# Patient Record
Sex: Female | Born: 1935 | Race: White | Hispanic: No | Smoking: Never smoker
Health system: Southern US, Community
[De-identification: ages and names within clinical notes are randomized; demographics above are authoritative.]

## PROBLEM LIST (undated history)

## (undated) DIAGNOSIS — F329 Major depressive disorder, single episode, unspecified: Secondary | ICD-10-CM

## (undated) DIAGNOSIS — F32A Depression, unspecified: Secondary | ICD-10-CM

## (undated) DIAGNOSIS — R413 Other amnesia: Secondary | ICD-10-CM

## (undated) DIAGNOSIS — M1990 Unspecified osteoarthritis, unspecified site: Secondary | ICD-10-CM

## (undated) DIAGNOSIS — M069 Rheumatoid arthritis, unspecified: Secondary | ICD-10-CM

## (undated) DIAGNOSIS — J302 Other seasonal allergic rhinitis: Secondary | ICD-10-CM

## (undated) DIAGNOSIS — M810 Age-related osteoporosis without current pathological fracture: Secondary | ICD-10-CM

## (undated) HISTORY — DX: Age-related osteoporosis without current pathological fracture: M81.0

## (undated) HISTORY — PX: EYE SURGERY: SHX253

## (undated) HISTORY — PX: HYSTERECTOMY: SHX81

## (undated) HISTORY — PX: TOTAL KNEE ARTHROPLASTY: SHX125

## (undated) HISTORY — DX: Rheumatoid arthritis, unspecified: M06.9

## (undated) HISTORY — PX: APPENDECTOMY (OPEN): SHX54

## (undated) HISTORY — DX: Unspecified osteoarthritis, unspecified site: M19.90

## (undated) HISTORY — DX: Other seasonal allergic rhinitis: J30.2

## (undated) HISTORY — PX: BACK SURGERY: SHX140

## (undated) HISTORY — PX: CHOLECYSTECTOMY: SHX55

## (undated) HISTORY — PX: HIP ARTHROPLASTY: SHX981

## (undated) HISTORY — PX: ABDOMINAL HYSTERECTOMY: SHX81

## (undated) HISTORY — PX: KYPHOPLASTY: SHX5884

## (undated) HISTORY — PX: APPENDECTOMY: SHX54

## (undated) HISTORY — PX: CATARACT EXTRACTION: SUR2

---

## 1997-11-13 ENCOUNTER — Inpatient Hospital Stay
Admission: RE | Admit: 1997-11-13 | Disposition: A | Discharge status: Home Health | Payer: Self-pay | Source: Ambulatory Visit | Admitting: Specialist

## 1999-11-11 ENCOUNTER — Inpatient Hospital Stay
Admission: RE | Admit: 1999-11-11 | Disposition: A | Discharge status: Home / Self Care | Payer: Self-pay | Source: Ambulatory Visit | Admitting: Specialist

## 2007-01-20 HISTORY — PX: HIP FRACTURE SURGERY: SHX118

## 2012-01-20 HISTORY — PX: HIP FRACTURE SURGERY: SHX118

## 2013-05-15 ENCOUNTER — Encounter (INDEPENDENT_AMBULATORY_CARE_PROVIDER_SITE_OTHER): Payer: Self-pay

## 2013-05-15 ENCOUNTER — Ambulatory Visit (INDEPENDENT_AMBULATORY_CARE_PROVIDER_SITE_OTHER): Payer: Medicare Other | Admitting: Family Medicine

## 2013-05-15 ENCOUNTER — Encounter (INDEPENDENT_AMBULATORY_CARE_PROVIDER_SITE_OTHER): Payer: Self-pay | Admitting: Family Medicine

## 2013-05-15 VITALS — BP 179/89 | HR 82 | Temp 98.3°F | Resp 14 | Ht <= 58 in | Wt 85.6 lb

## 2013-05-15 DIAGNOSIS — H543 Unqualified visual loss, both eyes: Secondary | ICD-10-CM

## 2013-05-15 DIAGNOSIS — M069 Rheumatoid arthritis, unspecified: Secondary | ICD-10-CM | POA: Insufficient documentation

## 2013-05-15 DIAGNOSIS — M1990 Unspecified osteoarthritis, unspecified site: Secondary | ICD-10-CM | POA: Insufficient documentation

## 2013-05-15 DIAGNOSIS — I10 Essential (primary) hypertension: Secondary | ICD-10-CM | POA: Insufficient documentation

## 2013-05-15 DIAGNOSIS — M810 Age-related osteoporosis without current pathological fracture: Secondary | ICD-10-CM

## 2013-05-15 DIAGNOSIS — Z111 Encounter for screening for respiratory tuberculosis: Secondary | ICD-10-CM

## 2013-05-15 DIAGNOSIS — R627 Adult failure to thrive: Secondary | ICD-10-CM | POA: Insufficient documentation

## 2013-05-15 NOTE — Progress Notes (Signed)
Subjective:       Patient ID: Holly Flowers is a 80 y.o. female.    HPI  Holly Flowers is a 80 y.o. female who comes in today for exam before nursing home visit.   Holly Flowers is currently living with son and daughter in law.  She is moving into an assisted living facility due to increase acuity of care for the family.  She has the problems listed below   She takes IBU 200 mg BID and a vitamin D tablet. The family has been supplementing her with ensure as well. Weight was dropping, but has recently stabilized.        Active Problems  Patient Active Problem List    Diagnosis Date Noted   . Osteoporosis 05/15/2013   . Osteoarthritis 05/15/2013   . Rheumatoid arthritis 05/15/2013   . HTN (hypertension) 05/15/2013   . Low, vision, both eyes 05/15/2013   . Failure to thrive in adult 05/15/2013       Past Surgical History  Past Surgical History   Procedure Date   . Hip fracture surgery 2009     left hip   . Hip fracture surgery 2014     left hip   . Total knee arthroplasty    . Cholecystectomy    . Appendectomy    . Hysterectomy    . Back surgery    . Eye surgery      lens replacement       Medications  No current outpatient prescriptions on file.       Allergies  Allergies   Allergen Reactions   . Iodine      itching   . Shellfish-Derived Products      Itching, hives, vomiting       Gyn History    Obstetric History     No data available     No LMP recorded. Patient is postmenopausal.    Social History  History     Social History   . Marital Status: Widowed     Spouse Name: N/A     Number of Children: N/A   . Years of Education: N/A     Occupational History   . retired      Social History Main Topics   . Smoking status: Never Smoker    . Smokeless tobacco: Not on file   . Alcohol Use: No   . Drug Use: No   . Sexually Active: No     Other Topics Concern   . Not on file     Social History Narrative   . No narrative on file       Family History  Family History   Problem Relation Age of Onset   . Diabetes Father    . Stroke Father     . Diabetes Son        Review of Systems  CONST: No wt change, no f/c/s  EYES:  Has limited vision  HENT:  No hearing loss, tinnitus, allergies. has teeth problems  CV:   No CP, palpitations, leg swelling  RESP:  No SOB, cough, wheeze  GI:   No n/v, diarrhea, constipation, abd pain, heartburn, blood in stool  GU:   No dysuria, frequency, incontinence, breast changes, abnormal vaginal bleeding or discharge  MS:  Has chronic diffuse joint pain  SKIN:   No rashes or concerning moles  ENDO:  No heat/cold intolerance, menstrual changes, polyuria, polydipsia  HEME:  No bruising/bleeding, swollen glands  NEURO:  No HA, LH, dizziness, numbness/tingling, weakness  PSYCH:  No depressed mood, anhedonia, anxiety                  Review of Systems        Objective:    Physical Exam    Physical Exam:  BP 179/89  Pulse 82  Temp 98.3 F (36.8 C) (Oral)  Resp 14  Ht 1.473 m (4\' 10" )  Wt 38.828 kg (85 lb 9.6 oz)  BMI 17.90 kg/m2  SpO2 97%  Wt Readings from Last 3 Encounters:   05/15/13 38.828 kg (85 lb 9.6 oz)     Vital signs reviewed  CONST: NAD, low body weight  HENT: B TMs normal, OP clear, normal dentition  EYES: PERRL, no pallor or scleral icterus, conjunctiva not injected  NECK: supple, no TM or nodules  LYMPH: no cervical or supraclavicular LAD  CV: RRR, no m/r/g.  No LE edema.  Pedal pulses present and equal.  RESP: CTAB, normal effort  GI: soft, NT/ND, NABS, no HSM  SKIN: warm, dry.  no visible rashes or concerning moles  MSK: Very frail. Marked kyphosis. Ulnar drift of all the fingers on both hands.  NEURO: Cranial nerves 2-12 intact, normal gait  PSYCH: A&O x3, normal mood and affect. Questions of cognitive function including short and long term memory are intact.    Holly Flowers is a 80 y.o. female who presents today for the Medicare AWV.      (1) Review of medical and social history    Names of other Physicians on care team:    Patient Active Problem List    Diagnosis Date Noted   . Osteoporosis 05/15/2013   .  Osteoarthritis 05/15/2013   . Rheumatoid arthritis 05/15/2013   . HTN (hypertension) 05/15/2013   . Low, vision, both eyes 05/15/2013   . Failure to thrive in adult 05/15/2013     Past Surgical History   Procedure Date   . Hip fracture surgery 2009     left hip   . Hip fracture surgery 2014     left hip   . Total knee arthroplasty    . Cholecystectomy    . Appendectomy    . Hysterectomy    . Back surgery    . Eye surgery      lens replacement     No current outpatient prescriptions on file.     Allergies   Allergen Reactions   . Iodine      itching   . Shellfish-Derived Products      Itching, hives, vomiting     Family History   Problem Relation Age of Onset   . Diabetes Father    . Stroke Father    . Diabetes Son      History     Social History   . Marital Status: Widowed     Spouse Name: N/A     Number of Children: N/A   . Years of Education: N/A     Occupational History   . retired      Social History Main Topics   . Smoking status: Never Smoker    . Smokeless tobacco: Not on file   . Alcohol Use: No   . Drug Use: No   . Sexually Active: No     Other Topics Concern   . Not on file     Social History Narrative   . No narrative on file  Diet: Regular with some chewing dysfunction from poor dentition.  Physical Activity: walks with walker about the home.    (2) Review of Risk Factors for Depression and Mood Disorders    Result of depression screen: Negative    (3) Review of Functional Ability, Level of Safety, and Psychosocial/Behavioral Risks        Results of HHIE-S:   ADLs: 4  IADLs: 1  (see attached form)  Fall Risk:   Get-Up-and-Go Test (if necessary): Significant. Walks with walker. Transfers without difficulty.   Home Safety: Handout given    (4) Examination    BP 179/89  Pulse 82  Temp 98.3 F (36.8 C) (Oral)  Resp 14  Ht 1.473 m (4\' 10" )  Wt 38.828 kg (85 lb 9.6 oz)  BMI 17.90 kg/m2  SpO2 97%  Visual Acuity Screen:  Cognitive assessment:  Other relevant factors based on medical and social  history:    (5) List of Risk Factors and Treatment Options  Nurtritional and self care. Needs assist to bathe and have meals and meds provided, able to self administer.       (6) Education, Counseling, Referral    Going into assisted living facility.       (7) Other preventative services    Needs dental care        Assessment:       1. Osteoporosis     2. Osteoarthritis     3. Rheumatoid arthritis     4. HTN (hypertension)     5. Screening for tuberculosis  TB Skin Test   6. Low, vision, both eyes     7. Failure to thrive in adult             Plan:       Home assessment is not needed, Faline is moving to an assisted living facility. I am going to recommend reduced salt intake, but will hold on recommending BP meds at this time. Agree with ensure nutritional supplement. Dental work may also help.  Should continue vitamin D and encourage weight bearing activities.  Forms are signed.  She will continue on current regimen of nutritional care and low dose of IBU for pain.

## 2016-10-31 ENCOUNTER — Encounter (HOSPITAL_COMMUNITY): Payer: Self-pay

## 2016-10-31 ENCOUNTER — Emergency Department (HOSPITAL_COMMUNITY): Payer: Medicare Other

## 2016-10-31 ENCOUNTER — Emergency Department (HOSPITAL_COMMUNITY)
Admission: EM | Admit: 2016-10-31 | Discharge: 2016-10-31 | Disposition: A | Discharge status: Home / Self Care | Payer: Medicare Other | Attending: Emergency Medicine | Admitting: Emergency Medicine

## 2016-10-31 DIAGNOSIS — S0990XA Unspecified injury of head, initial encounter: Secondary | ICD-10-CM

## 2016-10-31 DIAGNOSIS — Y998 Other external cause status: Secondary | ICD-10-CM | POA: Diagnosis not present

## 2016-10-31 DIAGNOSIS — Y92009 Unspecified place in unspecified non-institutional (private) residence as the place of occurrence of the external cause: Secondary | ICD-10-CM | POA: Diagnosis not present

## 2016-10-31 DIAGNOSIS — W01198A Fall on same level from slipping, tripping and stumbling with subsequent striking against other object, initial encounter: Secondary | ICD-10-CM | POA: Insufficient documentation

## 2016-10-31 DIAGNOSIS — Y9389 Activity, other specified: Secondary | ICD-10-CM | POA: Diagnosis not present

## 2016-10-31 DIAGNOSIS — S0101XA Laceration without foreign body of scalp, initial encounter: Secondary | ICD-10-CM | POA: Insufficient documentation

## 2016-10-31 DIAGNOSIS — S42201A Unspecified fracture of upper end of right humerus, initial encounter for closed fracture: Secondary | ICD-10-CM

## 2016-10-31 DIAGNOSIS — W19XXXA Unspecified fall, initial encounter: Secondary | ICD-10-CM

## 2016-10-31 DIAGNOSIS — Z23 Encounter for immunization: Secondary | ICD-10-CM | POA: Insufficient documentation

## 2016-10-31 MED ORDER — TRAMADOL HCL 50 MG PO TABS: 50.0000 mg | ORAL_TABLET | Freq: Four times a day (QID) | ORAL | 0 refills | Status: AC | PRN

## 2016-10-31 MED ORDER — TETANUS-DIPHTH-ACELL PERTUSSIS 5-2.5-18.5 LF-MCG/0.5 IM SUSP
0.5000 mL | Freq: Once | INTRAMUSCULAR | Status: AC
  Administered 2016-10-31: 0.5 mL via INTRAMUSCULAR
  Filled 2016-10-31: qty 0.5

## 2016-10-31 NOTE — ED Provider Notes (Signed)
[facility]-EMERGENCY DEPT Provider Note   CSN: [id number] Arrival date & time: 2016-11-22  1638     History   Chief Complaint Chief Complaint  Patient presents with  . Fall    HPI April Preston is a 83 y.o. female.  Patient at home tripped over dog while using her walker. Patient fell and hit her head on the floor. No loss of consciousness. Patient with complaint of right-sided head pain there was some bleeding from there. And right shoulder pain. Patient is not on any blood thinners. Patient without any chest pain shortness of breath neck pain or back pain no hip pain no lower extremity pain.      No past medical history on file.  There are no active problems to display for this patient.   No past surgical history on file.  OB History    No data available       Home Medications    Prior to Admission medications   Medication Sig Start Date End Date Taking? Authorizing Provider  traMADol (ULTRAM) 50 MG tablet Take 1 tablet (50 mg total) by mouth every 6 (six) hours as needed for moderate pain. 2016-11-22   Vanetta Mulders, MD    Family History No family history on file.  Social History Social History  Substance Use Topics  . Smoking status: Never Smoker  . Smokeless tobacco: Never Used  . Alcohol use No     Allergies   Shellfish allergy   Review of Systems Review of Systems  Constitutional: Negative for fever.  HENT: Negative for congestion.   Eyes: Negative for redness.  Respiratory: Negative for shortness of breath.   Cardiovascular: Negative for chest pain.  Gastrointestinal: Negative for abdominal pain, nausea and vomiting.  Genitourinary: Negative for dysuria.  Musculoskeletal: Negative for back pain, joint swelling and neck pain.  Skin: Positive for wound. Negative for rash.  Neurological: Negative for headaches.  Hematological: Does not bruise/bleed easily.  Psychiatric/Behavioral: Negative for confusion.     Physical Exam Updated Vital  Signs BP (!) 192/70 (BP Location: Left Arm)   Pulse 77   Temp 97.9 F (36.6 C) (Oral)   Resp 18   Ht 1.575 m ( )   Wt 38.6 kg (85 lb)   SpO2 99%   BMI 15.55 kg/m   Physical Exam  Constitutional: She appears well-developed and well-nourished. No distress.  HENT:  Head: Normocephalic.  Mouth/Throat: Oropharynx is clear and moist.  A right-sided scalp with about a 5 mm laceration. Closed no active bleeding.  Eyes: Pupils are equal, round, and reactive to light. Conjunctivae and EOM are normal.  Neck: Normal range of motion. Neck supple.  Cardiovascular: Normal rate, regular rhythm and normal heart sounds.   Pulmonary/Chest: Effort normal and breath sounds normal.  Abdominal: Soft. Bowel sounds are normal.  Musculoskeletal: Normal range of motion. She exhibits tenderness and deformity.  Oxford humerus deformity and tenderness. No obvious joint dislocation. No tenderness at the elbow or wrist to the right arm. Radial pulse 1+. Patient has bilateral arthritic changes to both hands.  Neurological: She is alert. No cranial nerve deficit or sensory deficit. She exhibits normal muscle tone. Coordination normal.  Skin: Skin is warm.  Nursing note and vitals reviewed.    ED Treatments / Results  Labs (all labs ordered are listed, but only abnormal results are displayed) Labs Reviewed - No data to display  EKG  EKG Interpretation None       Radiology Ct Head Wo Contrast  Result Date: 10/26/2016 CLINICAL DATA:  Cervical spine trauma.  High clinical risk. EXAM: CT HEAD WITHOUT CONTRAST CT CERVICAL SPINE WITHOUT CONTRAST TECHNIQUE: Multidetector CT imaging of the head and cervical spine was performed following the standard protocol without intravenous contrast. Multiplanar CT image reconstructions of the cervical spine were also generated. COMPARISON:  None. FINDINGS: CT HEAD FINDINGS Brain: No evidence of acute infarction, hemorrhage, hydrocephalus, extra-axial collection or mass  lesion/mass effect. Advanced brain parenchymal volume loss and periventricular microangiopathy. Vascular: Calcific atherosclerotic disease at the skullbase. Skull: Normal. Negative for fracture or focal lesion. Sinuses/Orbits: No acute finding. Other: Right parietal skull hematoma. CT CERVICAL SPINE FINDINGS Alignment: Minimal anterolisthesis of C6 on C7. Skull base and vertebrae: No acute fracture. No primary bone lesion or focal pathologic process. Soft tissues and spinal canal: No prevertebral fluid or swelling. No visible canal hematoma. Disc levels: Multilevel osteoarthritic changes with disc space narrowing, osteophyte formation. Severe multilevel posterior facet arthropathy. Upper chest: Negative. Other: None. IMPRESSION: No acute intracranial abnormality. Advanced brain parenchymal atrophy and chronic microvascular disease. No evidence of acute fracture of the cervical spine. Multilevel osteoarthritic changes. Minimal anterolisthesis of C6 on C7, likely degenerative. Electronically Signed   By: Ted Mcalpine M.D.   On: 10/23/2016 17:51   Ct Cervical Spine Wo Contrast  Result Date: 11/14/2016 CLINICAL DATA:  Cervical spine trauma.  High clinical risk. EXAM: CT HEAD WITHOUT CONTRAST CT CERVICAL SPINE WITHOUT CONTRAST TECHNIQUE: Multidetector CT imaging of the head and cervical spine was performed following the standard protocol without intravenous contrast. Multiplanar CT image reconstructions of the cervical spine were also generated. COMPARISON:  None. FINDINGS: CT HEAD FINDINGS Brain: No evidence of acute infarction, hemorrhage, hydrocephalus, extra-axial collection or mass lesion/mass effect. Advanced brain parenchymal volume loss and periventricular microangiopathy. Vascular: Calcific atherosclerotic disease at the skullbase. Skull: Normal. Negative for fracture or focal lesion. Sinuses/Orbits: No acute finding. Other: Right parietal skull hematoma. CT CERVICAL SPINE FINDINGS Alignment: Minimal  anterolisthesis of C6 on C7. Skull base and vertebrae: No acute fracture. No primary bone lesion or focal pathologic process. Soft tissues and spinal canal: No prevertebral fluid or swelling. No visible canal hematoma. Disc levels: Multilevel osteoarthritic changes with disc space narrowing, osteophyte formation. Severe multilevel posterior facet arthropathy. Upper chest: Negative. Other: None. IMPRESSION: No acute intracranial abnormality. Advanced brain parenchymal atrophy and chronic microvascular disease. No evidence of acute fracture of the cervical spine. Multilevel osteoarthritic changes. Minimal anterolisthesis of C6 on C7, likely degenerative. Electronically Signed   By: Ted Mcalpine M.D.   On: 11/18/2016 17:51   Dg Humerus Right  Result Date: 10/25/2016 CLINICAL DATA:  Right shoulder pain after fall. EXAM: RIGHT HUMERUS - 2+ VIEW COMPARISON:  None. FINDINGS: There is a predominantly transverse fracture through the proximal humerus surgical neck. Severe glenohumeral degenerative changes. High-riding humeral head, consistent with chronic rotator cuff pathology. Diffuse osteopenia. IMPRESSION: Predominantly transverse fracture through the proximal humerus surgical neck. Electronically Signed   By: Obie Dredge M.D.   On: 11/07/2016 17:48    Procedures Procedures (including critical care time)  Medications Ordered in ED Medications  Tdap (BOOSTRIX) injection 0.5 mL (not administered)     Initial Impression / Assessment and Plan / ED Course  I have reviewed the triage vital signs and the nursing notes.  Pertinent labs & imaging results that were available during my care of the patient were reviewed by me and considered in my medical decision making (see chart for details).    The patient has  post fall no syncope. No loss of consciousness. Patient with 5 mm laceration to the right side of the scalp. CT of head and neck without any acute findings. Patient also with proximal arm  pain on the right side. X-rays show proximal humerus fracture. Will be treated with sling and follow-up with orthopedics.  Scalp laceration does not require suturing gluing. Currently closed.   Final Clinical Impressions(s) / ED Diagnoses   Final diagnoses:  Fall, initial encounter  Injury of head, initial encounter  Laceration of scalp, initial encounter  Closed fracture of proximal end of right humerus, unspecified fracture morphology, initial encounter    New Prescriptions New Prescriptions   TRAMADOL (ULTRAM) 50 MG TABLET    Take 1 tablet (50 mg total) by mouth every 6 (six) hours as needed for moderate pain.     Vanetta Mulders, MD 11/03/16 [phone]

## 2016-10-31 NOTE — ED Triage Notes (Signed)
Pt was at home and tripped over dog while using cane. Pt hit right side of head first and then right shoulder. Pt denies SOB/chest pain, pt denies blood thinners.  Pt lives with daughter.  AOx2

## 2016-10-31 NOTE — Discharge Instructions (Signed)
Small laceration to the scalp along its own. May do a little bit of bleeding over the next 24 hours. CT of head without any acute findings. CT of neck without any acute injuries. The right proximal humerus is fractured this will heal with sling. But orthopedic follow-up will be important. Referral information for Dr. August Saucer provided. Call on Monday for follow-up. Tramadol as needed for pain. Also recommend contacting primary care doctor on Monday for assistance at home.  The sling is to remain in place at all times but can be removed to take a shower. Tetanus was updated today.

## 2016-11-19 DEATH — deceased

## 2017-01-08 ENCOUNTER — Other Ambulatory Visit: Payer: Self-pay | Admitting: Physician Assistant

## 2017-01-08 DIAGNOSIS — R413 Other amnesia: Secondary | ICD-10-CM

## 2017-01-13 ENCOUNTER — Ambulatory Visit
Admission: RE | Admit: 2017-01-13 | Discharge: 2017-01-13 | Disposition: A | Discharge status: Home / Self Care | Payer: Medicare Other | Source: Ambulatory Visit | Attending: Physician Assistant | Admitting: Physician Assistant

## 2017-01-13 DIAGNOSIS — R413 Other amnesia: Secondary | ICD-10-CM

## 2017-12-26 ENCOUNTER — Encounter: Payer: Self-pay | Admitting: Cardiology

## 2017-12-26 ENCOUNTER — Emergency Department (HOSPITAL_COMMUNITY): Payer: Medicare Other

## 2017-12-26 ENCOUNTER — Encounter (HOSPITAL_COMMUNITY): Admission: EM | Disposition: E | Payer: Self-pay | Source: Home / Self Care | Attending: Cardiovascular Disease

## 2017-12-26 ENCOUNTER — Inpatient Hospital Stay (HOSPITAL_COMMUNITY): Payer: Medicare Other

## 2017-12-26 DIAGNOSIS — M1990 Unspecified osteoarthritis, unspecified site: Secondary | ICD-10-CM | POA: Diagnosis present

## 2017-12-26 DIAGNOSIS — F329 Major depressive disorder, single episode, unspecified: Secondary | ICD-10-CM | POA: Diagnosis present

## 2017-12-26 DIAGNOSIS — Z833 Family history of diabetes mellitus: Secondary | ICD-10-CM | POA: Diagnosis not present

## 2017-12-26 DIAGNOSIS — Z79891 Long term (current) use of opiate analgesic: Secondary | ICD-10-CM

## 2017-12-26 DIAGNOSIS — Z66 Do not resuscitate: Secondary | ICD-10-CM | POA: Diagnosis present

## 2017-12-26 DIAGNOSIS — Z96649 Presence of unspecified artificial hip joint: Secondary | ICD-10-CM | POA: Diagnosis present

## 2017-12-26 DIAGNOSIS — W182XXA Fall in (into) shower or empty bathtub, initial encounter: Secondary | ICD-10-CM | POA: Diagnosis present

## 2017-12-26 DIAGNOSIS — S0990XA Unspecified injury of head, initial encounter: Secondary | ICD-10-CM | POA: Diagnosis present

## 2017-12-26 DIAGNOSIS — Y92009 Unspecified place in unspecified non-institutional (private) residence as the place of occurrence of the external cause: Secondary | ICD-10-CM

## 2017-12-26 DIAGNOSIS — Z820 Family history of epilepsy and other diseases of the nervous system: Secondary | ICD-10-CM | POA: Diagnosis not present

## 2017-12-26 DIAGNOSIS — Z681 Body mass index (BMI) 19 or less, adult: Secondary | ICD-10-CM | POA: Diagnosis not present

## 2017-12-26 DIAGNOSIS — R570 Cardiogenic shock: Secondary | ICD-10-CM

## 2017-12-26 DIAGNOSIS — R413 Other amnesia: Secondary | ICD-10-CM | POA: Insufficient documentation

## 2017-12-26 DIAGNOSIS — Z90710 Acquired absence of both cervix and uterus: Secondary | ICD-10-CM

## 2017-12-26 DIAGNOSIS — I2119 ST elevation (STEMI) myocardial infarction involving other coronary artery of inferior wall: Principal | ICD-10-CM

## 2017-12-26 DIAGNOSIS — R64 Cachexia: Secondary | ICD-10-CM | POA: Diagnosis present

## 2017-12-26 DIAGNOSIS — Y93E1 Activity, personal bathing and showering: Secondary | ICD-10-CM | POA: Diagnosis not present

## 2017-12-26 DIAGNOSIS — W19XXXA Unspecified fall, initial encounter: Secondary | ICD-10-CM

## 2017-12-26 DIAGNOSIS — R000 Tachycardia, unspecified: Secondary | ICD-10-CM | POA: Diagnosis present

## 2017-12-26 DIAGNOSIS — Z79899 Other long term (current) drug therapy: Secondary | ICD-10-CM | POA: Diagnosis not present

## 2017-12-26 DIAGNOSIS — R52 Pain, unspecified: Secondary | ICD-10-CM

## 2017-12-26 DIAGNOSIS — F0390 Unspecified dementia without behavioral disturbance: Secondary | ICD-10-CM | POA: Diagnosis present

## 2017-12-26 DIAGNOSIS — I700 Atherosclerosis of aorta: Secondary | ICD-10-CM | POA: Diagnosis present

## 2017-12-26 DIAGNOSIS — I2111 ST elevation (STEMI) myocardial infarction involving right coronary artery: Secondary | ICD-10-CM | POA: Insufficient documentation

## 2017-12-26 DIAGNOSIS — F32A Depression, unspecified: Secondary | ICD-10-CM | POA: Insufficient documentation

## 2017-12-26 HISTORY — DX: Major depressive disorder, single episode, unspecified: F32.9

## 2017-12-26 HISTORY — DX: Depression, unspecified: F32.A

## 2017-12-26 HISTORY — DX: Other amnesia: R41.3

## 2017-12-26 LAB — CBC WITH DIFFERENTIAL/PLATELET
Abs Immature Granulocytes: 0.99 10*3/uL — ABNORMAL HIGH (ref 0.00–0.07)
BASOS PCT: 1 %
Basophils Absolute: 0.1 10*3/uL (ref 0.0–0.1)
EOS ABS: 0 10*3/uL (ref 0.0–0.5)
EOS PCT: 0 %
HCT: 42.9 % (ref 36.0–46.0)
Hemoglobin: 12.5 g/dL (ref 12.0–15.0)
Immature Granulocytes: 4 %
Lymphocytes Relative: 6 %
Lymphs Abs: 1.4 10*3/uL (ref 0.7–4.0)
MCH: 31.5 pg (ref 26.0–34.0)
MCHC: 29.1 g/dL — AB (ref 30.0–36.0)
MCV: 108.1 fL — AB (ref 80.0–100.0)
MONO ABS: 2.9 10*3/uL — AB (ref 0.1–1.0)
MONOS PCT: 13 %
Neutro Abs: 17 10*3/uL — ABNORMAL HIGH (ref 1.7–7.7)
Neutrophils Relative %: 76 %
PLATELETS: 188 10*3/uL (ref 150–400)
RBC: 3.97 MIL/uL (ref 3.87–5.11)
RDW: 13.6 % (ref 11.5–15.5)
WBC: 22.4 10*3/uL — AB (ref 4.0–10.5)
nRBC: 0 % (ref 0.0–0.2)

## 2017-12-26 LAB — COMPREHENSIVE METABOLIC PANEL
ALT: 601 U/L — AB (ref 0–44)
AST: 684 U/L — AB (ref 15–41)
Albumin: 3.3 g/dL — ABNORMAL LOW (ref 3.5–5.0)
Alkaline Phosphatase: 70 U/L (ref 38–126)
Anion gap: 22 — ABNORMAL HIGH (ref 5–15)
BUN: 38 mg/dL — AB (ref 8–23)
CHLORIDE: 100 mmol/L (ref 98–111)
CO2: 10 mmol/L — AB (ref 22–32)
CREATININE: 2.13 mg/dL — AB (ref 0.44–1.00)
Calcium: 8.7 mg/dL — ABNORMAL LOW (ref 8.9–10.3)
Glucose, Bld: 188 mg/dL — ABNORMAL HIGH (ref 70–99)
POTASSIUM: 5.4 mmol/L — AB (ref 3.5–5.1)
Sodium: 132 mmol/L — ABNORMAL LOW (ref 135–145)
Total Bilirubin: 2.7 mg/dL — ABNORMAL HIGH (ref 0.3–1.2)
Total Protein: 7.2 g/dL (ref 6.5–8.1)

## 2017-12-26 LAB — LIPID PANEL
CHOL/HDL RATIO: 2.3 ratio
CHOLESTEROL: 189 mg/dL (ref 0–200)
HDL: 82 mg/dL (ref >40)
LDL Cholesterol: 95 mg/dL (ref 0–99)
Triglycerides: 58 mg/dL (ref <150)
VLDL: 12 mg/dL (ref 0–40)

## 2017-12-26 LAB — PROTIME-INR
INR: 1.74
PROTHROMBIN TIME: 20.1 s — AB (ref 11.4–15.2)

## 2017-12-26 LAB — TROPONIN I
Troponin I: 45.84 ng/mL (ref <0.03)
Troponin I: 44.56 ng/mL (ref <0.03)

## 2017-12-26 LAB — MRSA PCR SCREENING: MRSA by PCR: NEGATIVE

## 2017-12-26 LAB — MAGNESIUM: Magnesium: 2.5 mg/dL — ABNORMAL HIGH (ref 1.7–2.4)

## 2017-12-26 LAB — TSH: TSH: 6.251 u[IU]/mL — ABNORMAL HIGH (ref 0.350–4.500)

## 2017-12-26 LAB — APTT: aPTT: 34 seconds (ref 24–36)

## 2017-12-26 SURGERY — INVASIVE LAB ABORTED CASE

## 2017-12-26 MED ORDER — ONDANSETRON HCL 4 MG/2ML IJ SOLN
4.0000 mg | Freq: Once | INTRAMUSCULAR | Status: DC
  Filled 2017-12-26: qty 2

## 2017-12-26 MED ORDER — ASPIRIN 81 MG PO CHEW: 324.0000 mg | CHEWABLE_TABLET | ORAL | Status: DC

## 2017-12-26 MED ORDER — ASPIRIN EC 81 MG PO TBEC: 81.0000 mg | DELAYED_RELEASE_TABLET | Freq: Every day | ORAL | Status: DC

## 2017-12-26 MED ORDER — ONDANSETRON HCL 4 MG/2ML IJ SOLN: 4.0000 mg | Freq: Four times a day (QID) | INTRAMUSCULAR | Status: DC | PRN

## 2017-12-26 MED ORDER — HEPARIN (PORCINE) IN NACL 1000-0.9 UT/500ML-% IV SOLN
INTRAVENOUS | Status: AC
  Filled 2017-12-26: qty 1000

## 2017-12-26 MED ORDER — ORAL CARE MOUTH RINSE
15.0000 mL | Freq: Two times a day (BID) | OROMUCOSAL | Status: DC
  Administered 2017-12-26: 15 mL via OROMUCOSAL

## 2017-12-26 MED ORDER — NOREPINEPHRINE 4 MG/250ML-% IV SOLN
0.0000 ug/min | INTRAVENOUS | Status: DC
  Administered 2017-12-26: 20 ug/min via INTRAVENOUS
  Administered 2017-12-26: 10 ug/min via INTRAVENOUS

## 2017-12-26 MED ORDER — LIDOCAINE HCL (PF) 1 % IJ SOLN
INTRAMUSCULAR | Status: AC
  Filled 2017-12-26: qty 30

## 2017-12-26 MED ORDER — HEPARIN SODIUM (PORCINE) 5000 UNIT/ML IJ SOLN: 60.0000 [IU]/kg | Freq: Once | INTRAMUSCULAR | Status: DC

## 2017-12-26 MED ORDER — HEPARIN (PORCINE) IN NACL 2000-0.9 UNIT/L-% IV SOLN
INTRAVENOUS | Status: AC
  Filled 2017-12-26: qty 1000

## 2017-12-26 MED ORDER — ACETAMINOPHEN 325 MG PO TABS: 650.0000 mg | ORAL_TABLET | ORAL | Status: DC | PRN

## 2017-12-26 MED ORDER — HEPARIN (PORCINE) 25000 UT/250ML-% IV SOLN: 550.0000 [IU]/h | INTRAVENOUS | Status: DC

## 2017-12-26 MED ORDER — MORPHINE SULFATE (PF) 4 MG/ML IV SOLN: 4.0000 mg | INTRAVENOUS | Status: DC | PRN

## 2017-12-26 MED ORDER — ASPIRIN 300 MG RE SUPP: 300.0000 mg | RECTAL | Status: DC

## 2017-12-26 MED ORDER — SODIUM CHLORIDE 0.9 % IV SOLN
Freq: Once | INTRAVENOUS | Status: AC
  Administered 2017-12-26: 11:00:00 via INTRAVENOUS

## 2017-12-26 MED ORDER — SODIUM CHLORIDE 0.9 % IV SOLN: INTRAVENOUS | Status: DC

## 2017-12-26 MED ORDER — NITROGLYCERIN 0.4 MG SL SUBL: 0.4000 mg | SUBLINGUAL_TABLET | SUBLINGUAL | Status: DC | PRN

## 2017-12-26 MED ORDER — ASPIRIN 81 MG PO CHEW: 324.0000 mg | CHEWABLE_TABLET | Freq: Once | ORAL | Status: DC

## 2017-12-26 MED ORDER — HEPARIN BOLUS VIA INFUSION
1250.0000 [IU] | Freq: Once | INTRAVENOUS | Status: DC
  Filled 2017-12-26: qty 1250

## 2017-12-26 SURGICAL SUPPLY — 4 items
KIT HEART LEFT (KITS) IMPLANT
PACK CARDIAC CATHETERIZATION (CUSTOM PROCEDURE TRAY) IMPLANT
TRANSDUCER W/STOPCOCK (MISCELLANEOUS) IMPLANT
TUBING CIL FLEX 10 FLL-RA (TUBING) IMPLANT

## 2017-12-27 MED FILL — Medication: Qty: 1 | Status: AC

## 2018-01-19 NOTE — Progress Notes (Signed)
Medical examiner notified.

## 2018-01-19 NOTE — Death Summary Note (Deleted)
Patient ID: April Preston,  MRN: [id number], DOB/AGE: 1927-07-24 85 y.o.  Admit date: 06-02-16 Discharge date: 01/07/2018  Primary Care Provider: Barbie Preston, April H, MD Primary Cardiologist: Dr April Preston (new)  Discharge Diagnoses:  Inferior ST elevation MI  Cardiogenic shock  Head injury  DNR   Procedures: Head CT   Hospital Course: 85 year old female who lived with [place] in [place].  She had no prior history of coronary disease or prior cardiac evaluation.  In fact she was without serious medical problems.  She was treated for memory loss and depression.  She was noted to be somewhat cachectic and weighed about 85 pounds.  The patient's daughter says that she spoke to her mother about 1 AM on the day of admission, her mother was not feeling well and the daughter gave her an extra blanket.  The patient's daughter found the patient down in the shower with blood on the floor the shower later that morning.  When EMS arrived EKG showed inferior ST elevation and a code STEMI was activated.  The patient was seen in the emergency room by Dr. Allyson Preston.  Her blood pressure was in the 60s.  Respirations were assisted.  Patient was groggy and not alert or responsive.  After discussion with the patient's daughter it was decided to not be aggressive.  The patient's daughter is power of attorney. It was decided not to proceed with urgent cath. The patient was transferred to CCU on IV pressors. After discussion with the family this was treatment was withdrawn and at 13:00 the patient expired. The family was present.   Discharge Vitals:  Blood pressure (!) 152/79, pulse 78, temperature 97.9 F (36.6 C), temperature source Oral, resp. rate (!) 24, height 5\' 2"  (1.575 m), weight 38.6 kg, SpO2 97 %.    Labs:  Results for April PaymentGASCH, April Preston (MRN [id number]) as of 01/07/2018 09:11  Ref. Range 12/28/2017 09:32 01/17/2018 09:47 01/07/2018 10:28  COMPREHENSIVE METABOLIC PANEL Unknown Rpt (A)    Sodium  Latest Ref Range: 135 - 145 mmol/L 132 (L)    Potassium Latest Ref Range: 3.5 - 5.1 mmol/L 5.4 (Preston)    Chloride Latest Ref Range: 98 - 111 mmol/L 100    CO2 Latest Ref Range: 22 - 32 mmol/L 10 (L)    Glucose Latest Ref Range: 70 - 99 mg/dL 130188 (Preston)    BUN Latest Ref Range: 8 - 23 mg/dL 38 (Preston)    Creatinine Latest Ref Range: 0.44 - 1.00 mg/dL 8.652.13 (Preston)    Calcium Latest Ref Range: 8.9 - 10.3 mg/dL 8.7 (L)    Anion gap Latest Ref Range: 5 - 15  22 (Preston)    Alkaline Phosphatase Latest Ref Range: 38 - 126 U/L 70    Albumin Latest Ref Range: 3.5 - 5.0 g/dL 3.3 (L)    AST Latest Ref Range: 15 - 41 U/L 684 (Preston)    ALT Latest Ref Range: 0 - 44 U/L 601 (Preston)    Total Protein Latest Ref Range: 6.5 - 8.1 g/dL 7.2    Total Bilirubin Latest Ref Range: 0.3 - 1.2 mg/dL 2.7 (Preston)    GFR, Est Non African American Latest Ref Range: >60 mL/min NOT CALCULATED    GFR, Est African American Latest Ref Range: >60 mL/min NOT CALCULATED    Troponin I Latest Ref Range: <0.03 ng/mL 45.84 (HH)    Total CHOL/HDL Ratio Latest Units: RATIO 2.3    Cholesterol Latest Ref Range: 0 - 200 mg/dL 784189  HDL Cholesterol Latest Ref Range: >40 mg/dL 82    LDL (calc) Latest Ref Range: 0 - 99 mg/dL 95    Triglycerides Latest Ref Range: <150 mg/dL 58    VLDL Latest Ref Range: 0 - 40 mg/dL 12    WBC Latest Ref Range: 4.0 - 10.5 K/uL 22.4 (Preston)    RBC Latest Ref Range: 3.87 - 5.11 MIL/uL 3.97    Hemoglobin Latest Ref Range: 12.0 - 15.0 g/dL 08.612.5    HCT Latest Ref Range: 36.0 - 46.0 % 42.9    MCV Latest Ref Range: 80.0 - 100.0 fL 108.1 (Preston)    MCH Latest Ref Range: 26.0 - 34.0 pg 31.5    MCHC Latest Ref Range: 30.0 - 36.0 g/dL 57.829.1 (L)    RDW Latest Ref Range: 11.5 - 15.5 % 13.6    Platelets Latest Ref Range: 150 - 400 K/uL 188    nRBC Latest Ref Range: 0.0 - 0.2 % 0.0    Neutrophils Latest Units: % 76    Lymphocytes Latest Units: % 6    Monocytes Relative Latest Units: % 13    Eosinophil Latest Units: % 0    Basophil Latest Units: % 1      Immature Granulocytes Latest Units: % 4    NEUT# Latest Ref Range: 1.7 - 7.7 K/uL 17.0 (Preston)    Lymphocyte # Latest Ref Range: 0.7 - 4.0 K/uL 1.4    Monocyte # Latest Ref Range: 0.1 - 1.0 K/uL 2.9 (Preston)    Eosinophils Absolute Latest Ref Range: 0.0 - 0.5 K/uL 0.0    Basophils Absolute Latest Ref Range: 0.0 - 0.1 K/uL 0.1    Abs Immature Granulocytes Latest Ref Range: 0.00 - 0.07 K/uL 0.99 (Preston)    Prothrombin Time Latest Ref Range: 11.4 - 15.2 seconds 20.1 (Preston)    INR Unknown 1.74    APTT Latest Ref Range: 24 - 36 seconds 34    CT CERVICAL SPINE WO CONTRAST Unknown   Rpt  CT HEAD WO CONTRAST Unknown   Rpt   Head CT 01/16/2018-  IMPRESSION: 1. Head CT: No acute or traumatic finding. Atrophy and extensive chronic small-vessel ischemic changes. 2. Cervical spine CT: No acute or traumatic finding. Chronic degenerative changes as outlined above.  PCXR 01/06/2018- FINDINGS: The heart size and mediastinal contours are within normal limits. Both lungs are clear. Aortic atherosclerosis.  IMPRESSION: No active disease.  Pelvis 01/11/2018- FINDINGS: No acute finding. Previous bilateral ORIF for femur fractures. Old vertebral augmentation L4.  IMPRESSION: No acute finding.  EKG 01/02/2018- Sinus tachycardia 118, inferior STEMI    Disposition:  Pt expired in the CCU- DNR- family at bedside  Signed, Corine ShelterLuke Maguadalupe Lata PA-C 01/07/2018 9:05 AM

## 2018-01-19 NOTE — ED Provider Notes (Signed)
[facility] Provider [facility]   CSN: [id number] Arrival date & time: 12/27/2017  [id number]     History   Chief Complaint Chief Complaint  Patient presents with  . Code STEMI    HPI April Preston is a 85 y.o. female.  Level 5 caveat due to patient condition.   HPI   April Preston is a 85 y.o. female, with a history of depression and memory loss, presenting to the ED with STEMI.  Patient was reportedly found to have fallen in the shower.  EMS reports patient was cyanotic and hypotensive upon their arrival.  She has a DNR in place.  EMS noted inferior ST elevation and activated code STEMI.  Patient is able to answer some questions, such as her name.  She states she became short of breath and had chest pain prior to feeling weak and falling.  She will not answer when asked if she is in pain currently.    Past Medical History:  Diagnosis Date  . Depression   . Memory loss     Patient Active Problem List   Diagnosis Date Noted  . STEMI involving right coronary artery (HCC) 12/30/2017  . STEMI involving oth coronary artery of inferior wall (HCC) 01/12/2018  . Memory loss   . Depression     Past Surgical History:  Procedure Laterality Date  . ABDOMINAL HYSTERECTOMY    . APPENDECTOMY    . CATARACT EXTRACTION    . CHOLECYSTECTOMY    . HIP ARTHROPLASTY    . KYPHOPLASTY       OB History   None      Home Medications    Prior to Admission medications   Medication Sig Start Date End Date Taking? Authorizing Provider  citalopram (CELEXA) 20 MG tablet Take 20 mg by mouth daily.   Yes [provider]  Multiple Vitamins-Minerals (PRESERVISION AREDS PO) Take 1 tablet by mouth daily.   Yes [provider]  QUEtiapine (SEROQUEL) 25 MG tablet Take 25 mg by mouth 2 (two) times daily.   Yes [provider]  traMADol (ULTRAM) 50 MG tablet Take 1 tablet (50 mg total) by mouth every 6 (six) hours as needed for moderate  pain. Patient not taking: Reported on 12/23/2017 09-02-2016   Vanetta MuldersZackowski, Scott, MD    Family History Family History  Problem Relation Age of Onset  . Diabetes Father   . Parkinson's disease Sister     Social History Social History   Tobacco Use  . Smoking status: Never Smoker  . Smokeless tobacco: Never Used  Substance Use Topics  . Alcohol use: No  . Drug use: No     Allergies   Shellfish allergy; Asa [aspirin]; Donepezil; Iodine; and Rosuvastatin   Review of Systems Review of Systems  Unable to perform ROS: Acuity of condition     Physical Exam Updated Vital Signs BP (!) 121/91   Pulse (!) 113   Resp (!) 36   Ht 5\' 2"  (1.575 m)   Wt 44.3 kg   SpO2 97%   BMI 17.86 kg/m   Physical Exam  Constitutional: She appears well-developed and well-nourished. No distress.  HENT:  Head: Normocephalic.  Patient has a 1 cm laceration to left parietal region.  Airway appears patent.  Eyes: Pupils are equal, round, and reactive to light. Conjunctivae are normal.  Neck: Neck supple.  Cardiovascular: Regular rhythm, normal heart sounds and intact distal pulses. Tachycardia present.  Pulses palpable at the carotid and  femoral, but not radial. Extremities are cool to the touch.  Pulmonary/Chest: Breath sounds normal. Tachypnea (mild) noted. No respiratory distress.  Abdominal: Soft. There is no tenderness. There is no guarding.  Musculoskeletal: She exhibits no edema.  Overall trauma exam performed without abnormalities noted other than those mentioned.  Lymphadenopathy:    She has no cervical adenopathy.  Neurological: She is alert.  Eyes open to voice. Will intermittently answer questions, but will only answer name when orientation questions were asked.  She is noted to move all 4 of her extremities.  Skin: Skin is warm and dry. She is not diaphoretic.  Psychiatric: She has a normal mood and affect. Her behavior is normal.  Nursing note and vitals reviewed.    ED  Treatments / Results  Labs (all labs ordered are listed, but only abnormal results are displayed) Labs Reviewed  CBC WITH DIFFERENTIAL/PLATELET - Abnormal; Notable for the following components:      Result Value   WBC 22.4 (*)    MCV 108.1 (*)    MCHC 29.1 (*)    Neutro Abs 17.0 (*)    Monocytes Absolute 2.9 (*)    Abs Immature Granulocytes 0.99 (*)    All other components within normal limits  PROTIME-INR - Abnormal; Notable for the following components:   Prothrombin Time 20.1 (*)    All other components within normal limits  COMPREHENSIVE METABOLIC PANEL - Abnormal; Notable for the following components:   Sodium 132 (*)    Potassium 5.4 (*)    CO2 10 (*)    Glucose, Bld 188 (*)    BUN 38 (*)    Creatinine, Ser 2.13 (*)    Calcium 8.7 (*)    Albumin 3.3 (*)    AST 684 (*)    ALT 601 (*)    Total Bilirubin 2.7 (*)    Anion gap 22 (*)    All other components within normal limits  TROPONIN I - Abnormal; Notable for the following components:   Troponin I 45.84 (*)    All other components within normal limits  MRSA PCR SCREENING  APTT  LIPID PANEL  URINALYSIS, ROUTINE W REFLEX MICROSCOPIC  TROPONIN I  TROPONIN I  TROPONIN I  TSH  MAGNESIUM  HEPARIN LEVEL (UNFRACTIONATED)    EKG EKG Interpretation  Date/Time:  Sunday December 26 2017 09:28:31 EST Ventricular Rate:  118 PR Interval:    QRS Duration: 82 QT Interval:  331 QTC Calculation: 464 R Axis:   66 Text Interpretation:  Sinus tachycardia ST elevation in Inferior leads ** ** ACUTE MI / STEMI ** ** No previous tracing Confirmed by Cathren Laine ([id number]) on 01/17/2018 9:39:03 AM   Radiology Dg Pelvis 1-2 Views  Result Date: 01/14/2018 CLINICAL DATA:  Found in the shower unresponsive. EXAM: PELVIS - 1-2 VIEW COMPARISON:  None. FINDINGS: No acute finding. Previous bilateral ORIF for femur fractures. Old vertebral augmentation L4. IMPRESSION: No acute finding. Electronically Signed   By: Paulina Fusi M.D.   On:  01/16/2018 10:54   Ct Head Wo Contrast  Result Date: 01/12/2018 CLINICAL DATA:  Larey Seat in the shower. Cyanotic. Hypotensive. EXAM: CT HEAD WITHOUT CONTRAST CT CERVICAL SPINE WITHOUT CONTRAST TECHNIQUE: Multidetector CT imaging of the head and cervical spine was performed following the standard protocol without intravenous contrast. Multiplanar CT image reconstructions of the cervical spine were also generated. COMPARISON:  01/13/2017 FINDINGS: CT HEAD FINDINGS Brain: Chronic atrophy. Extensive chronic small-vessel ischemic changes throughout the hemispheric white matter. 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 No sign of acute infarction, mass lesion, hemorrhage, hydrocephalus or extra-axial collection. Vascular: There is atherosclerotic calcification of the major vessels at the base of the brain. Skull: Negative Sinuses/Orbits: Clear/normal Other: None CT CERVICAL SPINE FINDINGS Alignment: Normal Skull base and vertebrae: No fracture or primary bone lesion. Soft tissues and spinal canal: Negative Disc levels: Chronic fusion at C1-2. Chronic facet fusion at C3-4. Mild degenerative spondylosis at C5-6 and C6-7. Facet osteoarthritis on the left worse than the right. Upper chest: Negative Other: None IMPRESSION: 1. Head CT: No acute or traumatic finding. Atrophy and extensive chronic small-vessel ischemic changes. 2. Cervical spine CT: No acute or traumatic finding. Chronic degenerative changes as outlined above. Electronically Signed   By: Paulina Fusi M.D.   On: 01/10/2018 10:53   Ct Cervical Spine Wo Contrast  Result Date: 12/31/2017 CLINICAL DATA:  Larey Seat in the shower. Cyanotic. Hypotensive. EXAM: CT HEAD WITHOUT CONTRAST CT CERVICAL SPINE WITHOUT CONTRAST TECHNIQUE: Multidetector CT imaging of the head and cervical spine was performed following the standard protocol without intravenous contrast. Multiplanar CT image reconstructions of the cervical spine were also generated. COMPARISON:  01/13/2017 FINDINGS: CT  HEAD FINDINGS Brain: Chronic atrophy. Extensive chronic small-vessel ischemic changes throughout the hemispheric white matter. Old left basal ganglia infarction. No sign of acute infarction, mass lesion, hemorrhage, hydrocephalus or extra-axial collection. Vascular: There is atherosclerotic calcification of the major vessels at the base of the brain. Skull: Negative Sinuses/Orbits: Clear/normal Other: None CT CERVICAL SPINE FINDINGS Alignment: Normal Skull base and vertebrae: No fracture or primary bone lesion. Soft tissues and spinal canal: Negative Disc levels: Chronic fusion at C1-2. Chronic facet fusion at C3-4. Mild degenerative spondylosis at C5-6 and C6-7. Facet osteoarthritis on the left worse than the right. Upper chest: Negative Other: None IMPRESSION: 1. Head CT: No acute or traumatic finding. Atrophy and extensive chronic small-vessel ischemic changes. 2. Cervical spine CT: No acute or traumatic finding. Chronic degenerative changes as outlined above. Electronically Signed   By: Paulina Fusi M.D.   On: 12/31/2017 10:53   Dg Chest Portable 1 View  Result Date: 01/16/2018 CLINICAL DATA:  ST-elevation myocardial infarction. EXAM: PORTABLE CHEST 1 VIEW COMPARISON:  None. FINDINGS: The heart size and mediastinal contours are within normal limits. Both lungs are clear. Aortic atherosclerosis. IMPRESSION: No active disease. Electronically Signed   By: Myles Rosenthal M.D.   On: 01/16/2018 10:11    Procedures .Critical Care Performed by: Anselm Pancoast, PA-C Authorized by: Anselm Pancoast, PA-C   Critical care provider statement:    Critical care time (minutes):  35   Critical care was necessary to treat or prevent imminent or life-threatening deterioration of the following conditions:  Cardiac failure   Critical care was time spent personally by me on the following activities:  Discussions with consultants, development of treatment plan with patient or surrogate, examination of patient, evaluation of  patient's response to treatment, obtaining history from patient or surrogate, review of old charts, re-evaluation of patient's condition, pulse oximetry, ordering and review of radiographic studies, ordering and review of laboratory studies and ordering and performing treatments and interventions   I assumed direction of critical care for this patient from another provider in my specialty: no     (including critical care time)  Medications Ordered in ED Medications  0.9 %  sodium chloride infusion (has no administration in time range)  ondansetron (ZOFRAN) injection 4 mg (has no administration in time range)  norepinephrine (LEVOPHED) 4mg  in  D5W premix infusion (15 mcg/min Intravenous Rate/Dose Change 01/02/2018 1111)  aspirin chewable tablet 324 mg (has no administration in time range)    Or  aspirin suppository 300 mg (has no administration in time range)  aspirin EC tablet 81 mg (has no administration in time range)  nitroGLYCERIN (NITROSTAT) SL tablet 0.4 mg (has no administration in time range)  acetaminophen (TYLENOL) tablet 650 mg (has no administration in time range)  ondansetron (ZOFRAN) injection 4 mg (has no administration in time range)  heparin bolus via infusion 1,250 Units (has no administration in time range)  heparin ADULT infusion 100 units/mL (25000 units/290mL sodium chloride 0.45%) (has no administration in time range)  MEDLINE mouth rinse (15 mLs Mouth Rinse Given 01/10/2018 1100)  0.9 %  sodium chloride infusion ( Intravenous New Bag/Given 12/25/2017 1100)     Initial Impression / Assessment and Plan / ED Course  I have reviewed the triage vital signs and the nursing notes.  Pertinent labs & imaging results that were available during my care of the patient were reviewed by me and considered in my medical decision making (see chart for details).  Clinical Course as of Dec 26 1121  Wynelle Link Dec 26, 2017  1610 Spoke with Dr. Allyson Sabal, cardiology. Patient's family confirms DNR  and DNI. Agreed upon otherwise aggressive medical therapy. Dr. Gery Pray and his team will not be taking the patient to the Cath Lab.  After she has been reasonably assessed for traumatic injury, heparin can be started.   [SJ]  [id number] Dr. Gery Pray confirms his team will be admitting the patient.   [SJ]  1040 Spoke with Talbert Forest, patient's daughter and POA, at the bedside. She states patient was in the shower, walked out, and then called for help.  It was apparent that the patient had fallen in the shower and then had gotten back to her feet. She was not complaining of chest pain or shortness of breath before or after the fall. She is quite confused at baseline due to dementia. Recent history includes a cough, especially at night, for about the last week and subjective fever last night. She confirms the patient would like to avoid "treatments that are very invasive, like surgery, CPR, intubation, etc."   [SJ]  1047 RN informs me patient was taken straight to an inpatient bed on 2H and will not be returning to ED.    [SJ]    Clinical Course User Index [SJ] Raeshaun Simson C, PA-C    Patient presents with STEMI activated in the field.  Per patient's daughter and POA no invasive interventions are to be initiated, including cardiac cath. Patient arrived tachycardic and hypotensive.  Hypotension treated with Levophed. Patient was transported to her inpatient bed straight from the imaging department and did not return to the ED.    Findings and plan of care discussed with Cathren Laine, MD. Dr. Denton Lank personally evaluated and examined this patient.  Vitals:   01/08/2018 0951 01/11/2018 1000 12/27/2017 1030 12/22/2017 1055  BP: (!) 71/55 (!) 78/64 (!) 79/63 (!) 121/91  Pulse: (!) 113     Resp: (!) 30 (!) 36 (!) 33 (!) 36  SpO2: 97%     Weight:  44.3 kg    Height:         Final Clinical Impressions(s) / ED Diagnoses   Final diagnoses:  ST elevation myocardial infarction (STEMI) involving other coronary artery  of inferior wall (HCC)  Fall, initial encounter  Injury of head, initial encounter  ED Discharge Orders    None       Concepcion Living January 20, 2018 1126    Cathren Laine, MD 20-Jan-2018 1259    Cathren Laine, MD 01/20/2018 [phone]

## 2018-01-19 NOTE — H&P (Addendum)
Cardiology Admission History and Physical:   Patient ID: April Preston MRN: [id number]; DOB: 1927/11/22   Admission date: 01/07/2018  Primary Care Provider: Barbie Banner, MD Primary Cardiologist: new  Chief Complaint:  Found down at home  Patient Profile:   April Preston is a 85 y.o. female found down at home by her daughter.  History of Present Illness:   Ms. April Preston is an 85 year old female who lives with her daughter in [place].  She has no prior history of coronary disease or prior cardiac evaluation.  In fact she is without serious medical problems.  She is treated for memory loss and depression.  She is somewhat cachectic and weighs about 85 pounds.  The patient's daughter says that she spoke to her mother about 1 AM, her mother was not feeling well and the daughter gave her an extra blanket.  The patient's daughter found the patient down in the shower with blood on the floor the shower.  When EMS arrived EKG showed inferior ST elevation and a code STEMI was activated.  The patient was seen in the emergency room by Dr. Gery Pray.  Her blood pressure is in the 60s.  Respirations were assisted.  Patient was groggy and not alert.  After discussion with the patient's daughter it was decided to not be aggressive.  The patient's daughter is power of attorney.     Past Medical History:  Diagnosis Date  . Depression   . Memory loss     Past Surgical History:  Procedure Laterality Date  . ABDOMINAL HYSTERECTOMY    . APPENDECTOMY    . CATARACT EXTRACTION    . CHOLECYSTECTOMY    . HIP ARTHROPLASTY    . KYPHOPLASTY       Medications Prior to Admission: Prior to Admission medications   Medication Sig Start Date End Date Taking? Authorizing Provider  traMADol (ULTRAM) 50 MG tablet Take 1 tablet (50 mg total) by mouth every 6 (six) hours as needed for moderate pain. Nov 06, 2016   Vanetta Mulders, MD     Allergies:    Allergies  Allergen Reactions  . Shellfish Allergy   . Asa [Aspirin]     . Iodine     Social History:   Social History   Socioeconomic History  . Marital status: Widowed    Spouse name: Not on file  . Number of children: Not on file  . Years of education: Not on file  . Highest education level: Not on file  Occupational History  . Not on file  Social Needs  . Financial resource strain: Not on file  . Food insecurity:    Worry: Not on file    Inability: Not on file  . Transportation needs:    Medical: Not on file    Non-medical: Not on file  Tobacco Use  . Smoking status: Never Smoker  . Smokeless tobacco: Never Used  Substance and Sexual Activity  . Alcohol use: No  . Drug use: No  . Sexual activity: Never  Lifestyle  . Physical activity:    Days per week: Not on file    Minutes per session: Not on file  . Stress: Not on file  Relationships  . Social connections:    Talks on phone: Not on file    Gets together: Not on file    Attends religious service: Not on file    Active member of club or organization: Not on file    Attends meetings of clubs or organizations: Not on  file    Relationship status: Not on file  . Intimate partner violence:    Fear of current or ex partner: Not on file    Emotionally abused: Not on file    Physically abused: Not on file    Forced sexual activity: Not on file  Other Topics Concern  . Not on file  Social History Narrative  . Not on file    Family History:   The patient's family history includes Diabetes in her father; Parkinson's disease in her sister.    ROS:  Please see the history of present illness.  ROS unobtainable- pt unresponsive     Physical Exam/Data:   Vitals:   12/23/2017 0946  Weight: 40.8 kg  Height: 5\' 2"  (1.575 m)   No intake or output data in the 24 hours ending 01/11/2018 0949 Filed Weights   12/25/2017 0946  Weight: 40.8 kg   Body mass index is 16.46 kg/m.  General:  Frail, thin Caucasian female- appears her stated age HEENT: Lt sided contusion Neck: no JVD Endocrine:   No thryomegaly Vascular: No carotid bruits; pulses diminished Cardiac:  RRR, decreased heart sounds Lungs:  clear to auscultation bilaterally, no wheezing, rhonchi or rales  Abd: soft, not distended Ext: no edema Musculoskeletal:  No deformities, BUE and BLE strength normal and equal Skin: cool, pale Neuro:  Not responsive   EKG:  The ECG that was done 12/29/2017 was personally reviewed and demonstrates NSR, ST, inferior STE  Laboratory Data:  ChemistryNo results for input(s): NA, K, CL, CO2, GLUCOSE, BUN, CREATININE, CALCIUM, GFRNONAA, GFRAA, ANIONGAP in the last 168 hours.  No results for input(s): PROT, ALBUMIN, AST, ALT, ALKPHOS, BILITOT in the last 168 hours. HematologyNo results for input(s): WBC, RBC, HGB, HCT, MCV, MCH, MCHC, RDW, PLT in the last 168 hours. Cardiac EnzymesNo results for input(s): TROPONINI in the last 168 hours. No results for input(s): TROPIPOC in the last 168 hours.  BNPNo results for input(s): BNP, PROBNP in the last 168 hours.  DDimer No results for input(s): DDIMER in the last 168 hours.  Radiology/Studies:  No results found.  Assessment and Plan:   Inferior ST elevation MI  Cardiogenic shock  Head injury  DNR  Plan- Patient will be admitted to the intensive care unit, she needs a head CT, will add heparin depending on this. Levophed started in ED. Will use pressors and medical Rx but no intubation, CPR, defibrillation, or cath. Discussed ASA allergy with pt's daughter- no details but it sounds more like ASA intolerance.   Severity of Illness: The appropriate patient status for this patient is INPATIENT. Inpatient status is judged to be reasonable and necessary in order to provide the required intensity of service to ensure the patient's safety. The patient's presenting symptoms, physical exam findings, and initial radiographic and laboratory data in the context of their chronic comorbidities is felt to place them at high risk for further clinical  deterioration. Furthermore, it is not anticipated that the patient will be medically stable for discharge from the hospital within 2 midnights of admission. The following factors support the patient status of inpatient.   " The patient's presenting symptoms include unresponsive. " The worrisome physical exam findings include head injury, shock. " The initial radiographic and laboratory data are worrisome because of STEMI. " The chronic co-morbidities include depresion.   * I certify that at the point of admission it is my clinical judgment that the patient will require inpatient hospital care spanning beyond 2 midnights  from the point of admission due to high intensity of service, high risk for further deterioration and high frequency of surveillance required.*    For questions or updates, please contact [facility] Please consult [email or web address] for contact info under     Signed, Corine ShelterLuke Kilroy, PA-C  01/16/2018 9:49 AM   Agree with note written by Corine ShelterLuke Kilroy PAC  85 year old frail appearing caucasian female who was transported to the ER by EMS with an inferior STEMI and CGS. She has no other signif comorbities except arthritis and mild dementia. She apparently fell in the shower this AM and hit her head. Also was complaining of hip pain. Her SBP was in the 60 range and her ELG showed inferior STEMI. She was alert and communicative but in pain. I discussed with her daughter who is the patient's HPOA in the presence of Corine ShelterLuke Kilroy PA-C. She let us know that the patient did not want to be intubated, CPR or any invasive procedures. Based on this I made the decision not to take her to the cath lab. I did start her on levophed and got a head CT to clear her for IV heparin. I assured her daughter that the patient would get aggressive medical care but that she was critically ill and may not survive the hospitalization.               Nanetta BattyJonathan Makya Phillis 12/29/2017 12:57 PM

## 2018-01-19 NOTE — Death Summary Note (Signed)
Patient ID: April Preston,  MRN: [id number], DOB/AGE: Jan 28, 1927 85 y.o.  Admit date: 12/27/2017 Date of death: 01/13/2018  Primary Care Provider: Barbie BannerWilson, Fred H, MD Primary Cardiologist: Dr Allyson SabalBerry (new)  Discharge Diagnoses:  Inferior ST elevation MI  Cardiogenic shock  Head injury  DNR   Procedures: Head CT   Hospital Course: 85 year old female who [place]. She had no prior history of coronary disease or prior cardiac evaluation. In fact she was without serious medical problems. She was treated for memory loss and depression. She was noted to be somewhat cachectic and weighed about 85 pounds. The patient's daughter says that she spoke to her mother about 1 AM on the day of admission, her mother was not feeling well and the daughter gave her an extra blanket. The patient's daughter found the patient down in the shower with blood on the floor the shower later that morning. When EMS arrived EKG showed inferior ST elevation and a code STEMI was activated. The patient was seen in the emergency room by Dr. Allyson SabalBerry. Her blood pressure was in the 60s. Respirations were assisted. Patient was groggy and not alert or responsive. After discussion with the patient's daughter it was decided to not be aggressive. The patient's daughter is power of attorney. It was decided not to proceed with urgent cath. The patient was transferred to CCU on IV pressors. After discussion with the family this was treatment was withdrawn and at 13:00 the patient expired. The family was present.   Discharge Vitals:  Blood pressure (!) 152/79, pulse 78, temperature 97.9 F (36.6 C), temperature source Oral, resp. rate (!) 24, height 5\' 2"  (1.575 m), weight 38.6 kg, SpO2 97 %.    Labs:  Results for April Preston (MRN [id number]) as of 01/07/2018 09:11  Ref. Range 12/29/2017 09:32 12/28/2017 09:47 01/14/2018 10:28  COMPREHENSIVE METABOLIC PANEL Unknown Rpt (A)     Sodium Latest Ref Range: 135 - 145 mmol/L 132 (L)    Potassium Latest Ref Range: 3.5 - 5.1 mmol/L 5.4 (H)    Chloride Latest Ref Range: 98 - 111 mmol/L 100    CO2 Latest Ref Range: 22 - 32 mmol/L 10 (L)    Glucose Latest Ref Range: 70 - 99 mg/dL 086188 (H)    BUN Latest Ref Range: 8 - 23 mg/dL 38 (H)    Creatinine Latest Ref Range: 0.44 - 1.00 mg/dL 5.782.13 (H)    Calcium Latest Ref Range: 8.9 - 10.3 mg/dL 8.7 (L)    Anion gap Latest Ref Range: 5 - 15  22 (H)    Alkaline Phosphatase Latest Ref Range: 38 - 126 U/L 70    Albumin Latest Ref Range: 3.5 - 5.0 g/dL 3.3 (L)    AST Latest Ref Range: 15 - 41 U/L 684 (H)    ALT Latest Ref Range: 0 - 44 U/L 601 (H)    Total Protein Latest Ref Range: 6.5 - 8.1 g/dL 7.2    Total Bilirubin Latest Ref Range: 0.3 - 1.2 mg/dL 2.7 (H)    GFR, Est Non African American Latest Ref Range: >60 mL/min NOT CALCULATED    GFR, Est African American Latest Ref Range: >60 mL/min NOT CALCULATED    Troponin I Latest Ref Range: <0.03 ng/mL 45.84 (HH)    Total CHOL/HDL Ratio Latest Units: RATIO 2.3    Cholesterol Latest Ref Range: 0 - 200 mg/dL 469189    HDL Cholesterol Latest Ref Range: >40 mg/dL 82  LDL (calc) Latest Ref Range: 0 - 99 mg/dL 95    Triglycerides Latest Ref Range: <150 mg/dL 58    VLDL Latest Ref Range: 0 - 40 mg/dL 12    WBC Latest Ref Range: 4.0 - 10.5 K/uL 22.4 (H)    RBC Latest Ref Range: 3.87 - 5.11 MIL/uL 3.97    Hemoglobin Latest Ref Range: 12.0 - 15.0 g/dL 47.812.5    HCT Latest Ref Range: 36.0 - 46.0 % 42.9    MCV Latest Ref Range: 80.0 - 100.0 fL 108.1 (H)    MCH Latest Ref Range: 26.0 - 34.0 pg 31.5    MCHC Latest Ref Range: 30.0 - 36.0 g/dL 29.529.1 (L)    RDW Latest Ref Range: 11.5 - 15.5 % 13.6    Platelets Latest Ref Range: 150 - 400 K/uL 188    nRBC Latest Ref Range: 0.0 - 0.2 % 0.0    Neutrophils Latest Units: % 76    Lymphocytes Latest Units: % 6    Monocytes Relative  Latest Units: % 13    Eosinophil Latest Units: % 0    Basophil Latest Units: % 1    Immature Granulocytes Latest Units: % 4    NEUT# Latest Ref Range: 1.7 - 7.7 K/uL 17.0 (H)    Lymphocyte # Latest Ref Range: 0.7 - 4.0 K/uL 1.4    Monocyte # Latest Ref Range: 0.1 - 1.0 K/uL 2.9 (H)    Eosinophils Absolute Latest Ref Range: 0.0 - 0.5 K/uL 0.0    Basophils Absolute Latest Ref Range: 0.0 - 0.1 K/uL 0.1    Abs Immature Granulocytes Latest Ref Range: 0.00 - 0.07 K/uL 0.99 (H)    Prothrombin Time Latest Ref Range: 11.4 - 15.2 seconds 20.1 (H)    INR Unknown 1.74    APTT Latest Ref Range: 24 - 36 seconds 34    CT CERVICAL SPINE WO CONTRAST Unknown   Rpt  CT HEAD WO CONTRAST Unknown   Rpt   Head CT 01/13/2018-  IMPRESSION: 1. Head CT: No acute or traumatic finding. Atrophy and extensive chronic small-vessel ischemic changes. 2. Cervical spine CT: No acute or traumatic finding. Chronic degenerative changes as outlined above.  PCXR 01/01/2018- FINDINGS: The heart size and mediastinal contours are within normal limits. Both lungs are clear. Aortic atherosclerosis.  IMPRESSION: No active disease.  Pelvis 12/22/2017- FINDINGS: No acute finding. Previous bilateral ORIF for femur fractures. Old vertebral augmentation L4.  IMPRESSION: No acute finding.  EKG 01/05/2018- Sinus tachycardia 118, inferior STEMI    Disposition:  Pt expired in the CCU- DNR- family at bedside  Signed: Corine ShelterLUKE Amanada Philbrick PA-C 01/13/2018 4:28 PM

## 2018-01-19 NOTE — Progress Notes (Signed)
ANTICOAGULATION CONSULT NOTE - Initial Consult  Pharmacy Consult for Heparin Indication: chest pain/ACS  Allergies  Allergen Reactions  . Shellfish Allergy   . Asa [Aspirin]   . Donepezil Other (See Comments)    Nightmares and sleep disturbance  . Iodine   . Rosuvastatin Other (See Comments)    nightmares    Patient Measurements: Height: 5\' 2"  (157.5 cm) Weight: 90 lb (40.8 kg) IBW/kg (Calculated) : 50.1 Heparin Dosing Weight: 40.8 kg  Vital Signs:    Labs: Recent Labs    2017-10-29 0932  HGB 12.5  HCT 42.9  PLT 188  APTT 34  LABPROT 20.1*  INR 1.74    CrCl cannot be calculated (No successful lab value found.).   Medical History: Past Medical History:  Diagnosis Date  . Depression   . Memory loss     Assessment: [age] YOF who presented on 12/8 after being found down after falling in the shower - cynatic and hypotensive on presentation. EKG showed STEMI and cards consulted. The patient is noted to have L-sided hematoma related to the fall with head CT negative for intracranial bleeding. The patient was not on any blood thinners PTA. Pharmacy consulted to start Heparin for ACS/CP/STEMI.   Will give a half bolus on initiation given L-sided facial hematoma (superficial), baseline CBC wnl.   Goal of Therapy:  Heparin level 0.3-0.7 units/ml Monitor platelets by anticoagulation protocol: Yes   Plan:  - Heparin 1250 unit bolus x 1 - Start Heparin at a drip rate of 550 units/hr (5.5 ml/hr) - Will continue to monitor for any signs/symptoms of bleeding and will follow up with heparin level in 8 hours  Thank you for allowing pharmacy to be a part of this patient's care.  Georgina PillionElizabeth Darling Cieslewicz, PharmD, BCPS Clinical Pharmacist Pager: [phone] Clinical phone for 01/17/2018 from 7a-3:30p: [phone] If after 3:30p, please call main pharmacy at: [phone] Please check [facility]ION for all [facility] numbers 01/17/2018 11:04 AM

## 2018-01-19 NOTE — Progress Notes (Signed)
   12/23/2017 1300  Clinical Encounter Type  Visited With Patient and family together  Visit Type Follow-up  Referral From Nurse  Consult/Referral To Chaplain  Spiritual Encounters  Spiritual Needs Emotional;Grief support  Stress Factors  Patient Stress Factors None identified  Family Stress Factors Loss;Major life changes   Responded to page from floor Nurse for family support. PT passed and family was emotional. Family was thankful for the Chaplain presence and daughter said that she notified PT's Priest. I offered spiritual support with words of encouragement, a listening ear, and Ministerial presence. Nursing staff was very Designer, industrial/productgracious and professional.  Lennie Hummerhaplain Eleonora Peeler [phone]

## 2018-01-19 NOTE — Progress Notes (Addendum)
Pt with no RR or HR. Verified by two RNs per protocol at 1225.  MD notified. CDS notified. Family at bedside. Continual emotional support provided. Chaplain provided. Oil hand prints completed for family. Checklist and post mortum care to be completed.

## 2018-01-19 NOTE — ED Notes (Signed)
Pt taken straight to 2H from CT and Xray [facility]; ED provider notified and family made aware.

## 2018-01-19 NOTE — Progress Notes (Signed)
   01/12/2018 1000  Clinical Encounter Type  Visited With Patient;Family  Visit Type Initial  Referral From Nurse  Consult/Referral To Chaplain  Spiritual Encounters  Spiritual Needs Emotional;Prayer  Stress Factors  Patient Stress Factors None identified  Family Stress Factors Health changes  Responded to code stemi page. PT was being treated and family was being notified by the cardiologist in consult room. I offered spiritual care for family and escorted them to see PT. I prayed and offered words of encouragement. Chaplain available as needed.   Chaplain Orest DikesAbel Neenah Canter [phone]

## 2018-01-19 NOTE — ED Triage Notes (Signed)
Pt arrived via [facility]; pt found in shower by neighbor who is a Charity fundraiserN; pt apparently fell in shower  And was cyanotic and hypotensive upon EMS arrival; pt has no cardiac hx; family chooses intubation; 12 lead shows inferior elevation; pt c/o of some bilat hip pain; hematoma to L side of head; BP 65/40; 120 HR;  Pt rec'd 250 ml of NS

## 2018-01-19 DEATH — deceased

## 2018-11-02 IMAGING — CT CT HEAD W/O CM
1 series · 16 of 30 positions shown, 20 images · non-contrast
Comparison: None.

CLINICAL DATA: 89-year-old female with a history of memory loss and
dementia

EXAM:
CT HEAD WITHOUT CONTRAST
TECHNIQUE: Contiguous axial images were obtained from the base of the skull
through the vertex without intravenous contrast.

[Series 2: head w/(date) · axial · 0.39mm/px · z∈[-193,-48]mm · 16 of 33 slices shown, 20 images]
[im 2/33  brain]
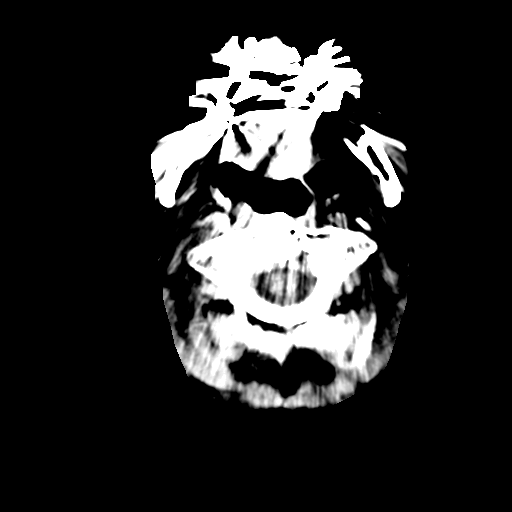
[im 2/33  bone]
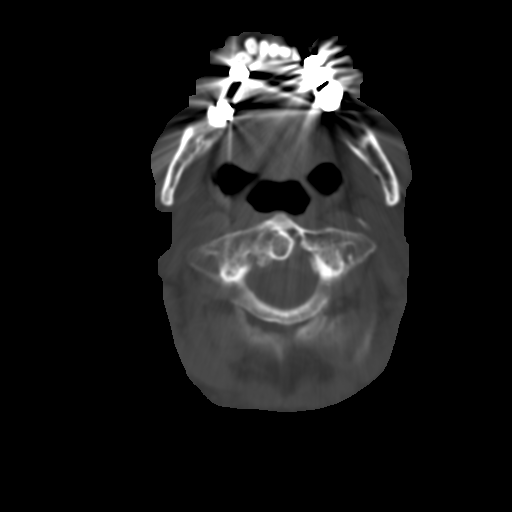
[im 4/33  brain]
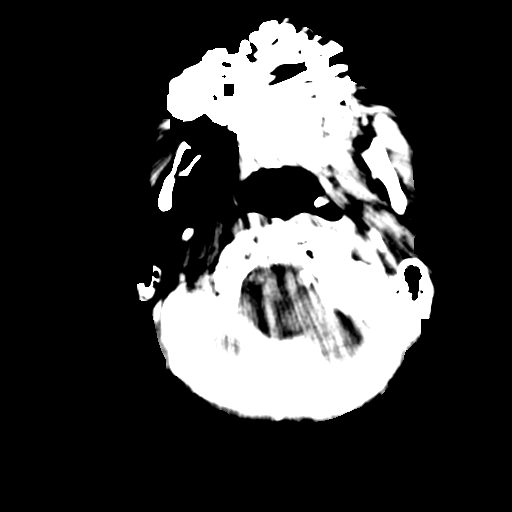
[im 6/33  brain]
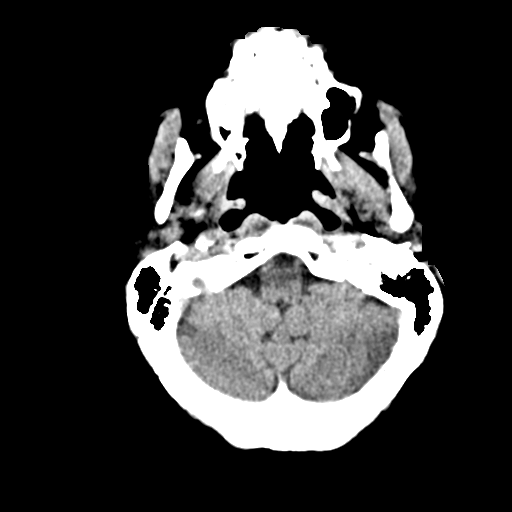
[im 8/33  brain]
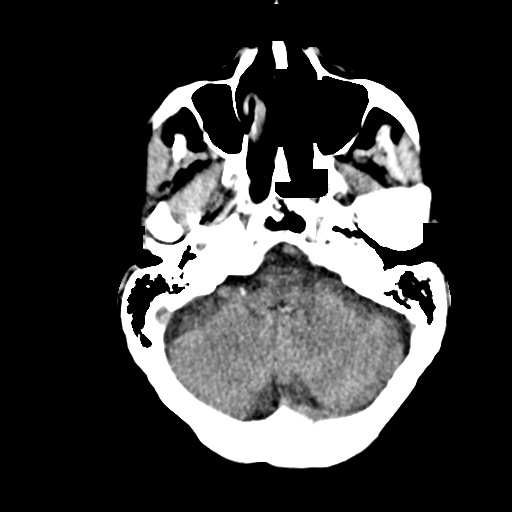
[im 9/33  brain]
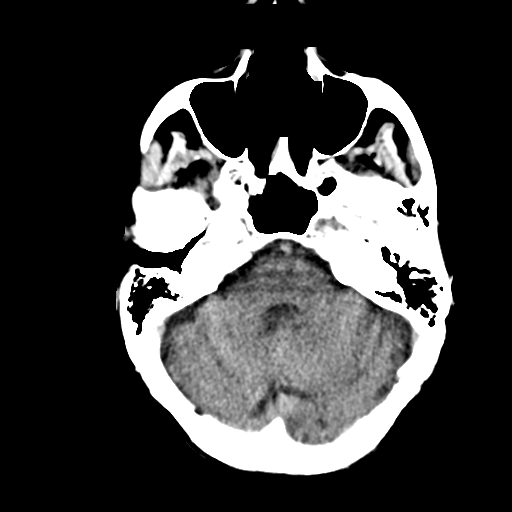
[im 9/33  bone]
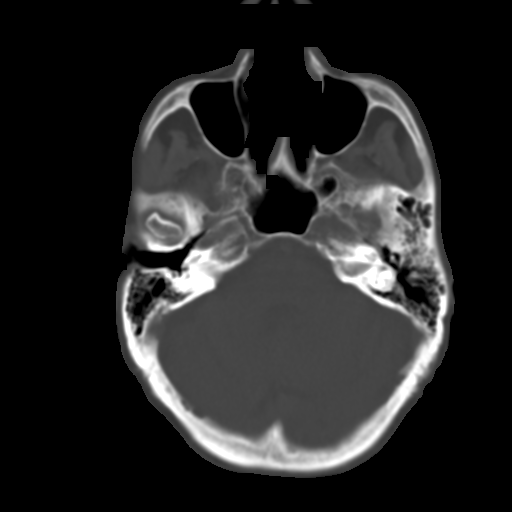
[im 12/33  brain]
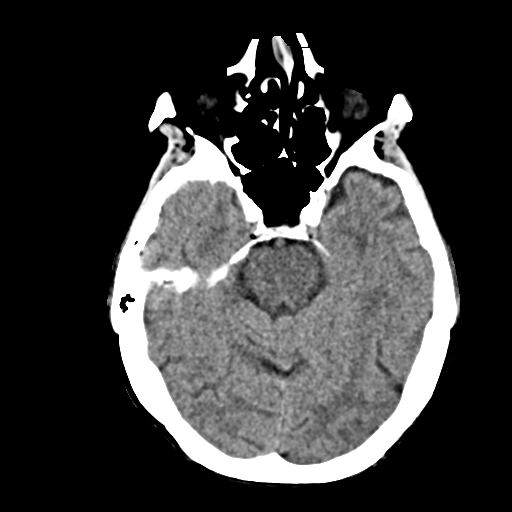
[im 14/33  brain]
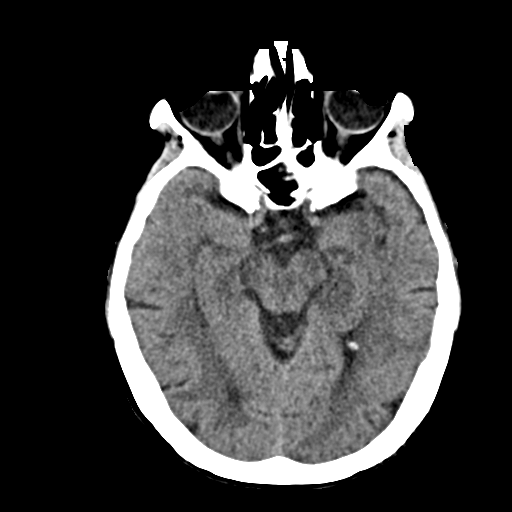
[im 16/33  brain]
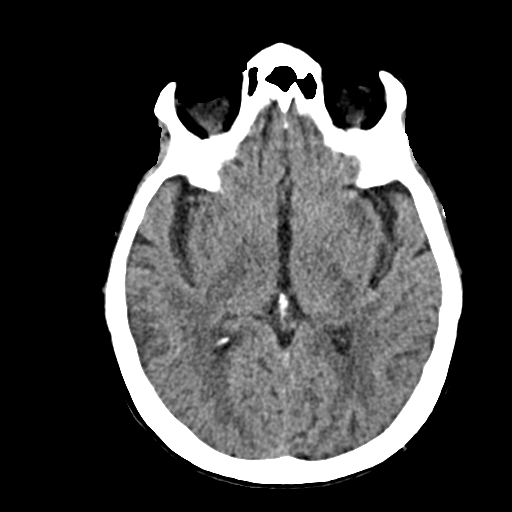
[im 17/33  brain]
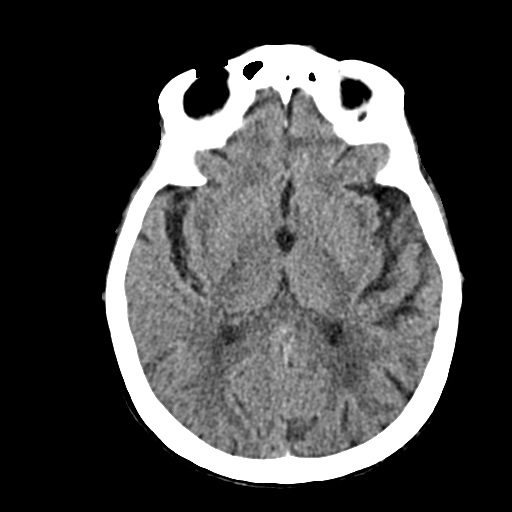
[im 17/33  bone]
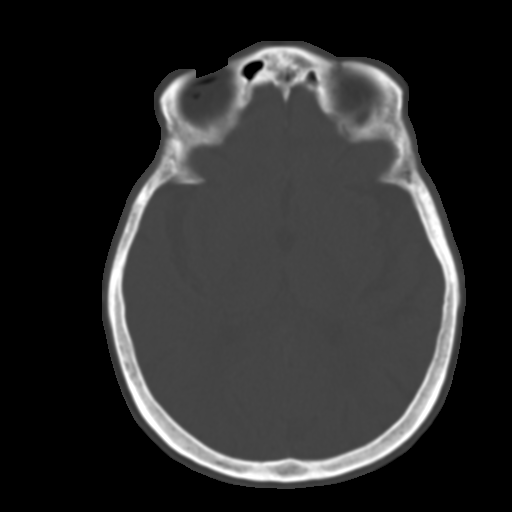
[im 19/33  brain]
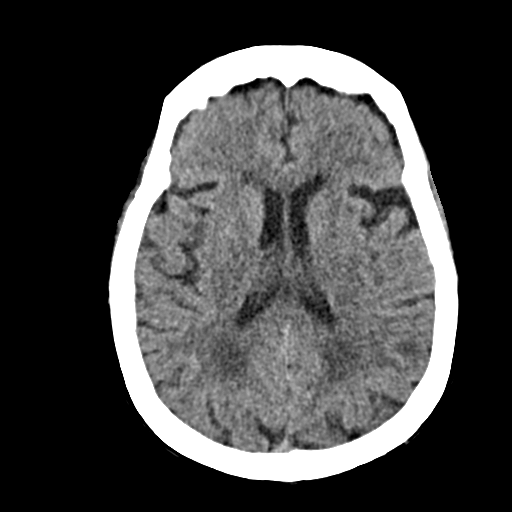
[im 21/33  brain]
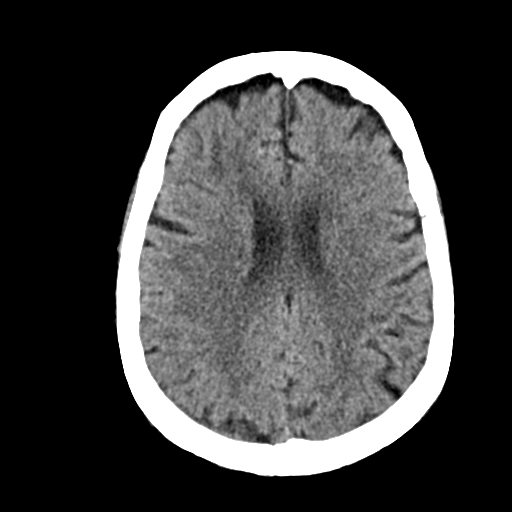
[im 24/33  brain]
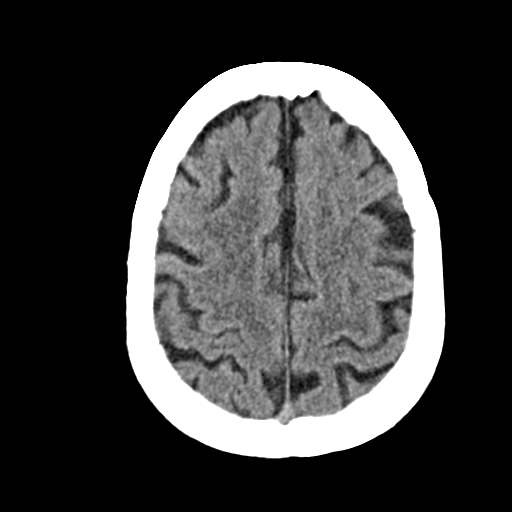
[im 25/33  brain]
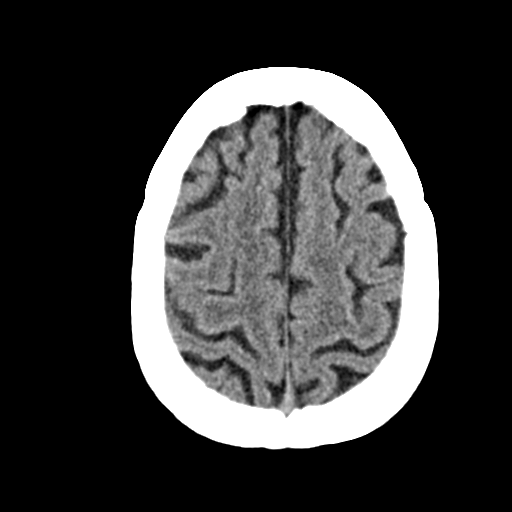
[im 25/33  bone]
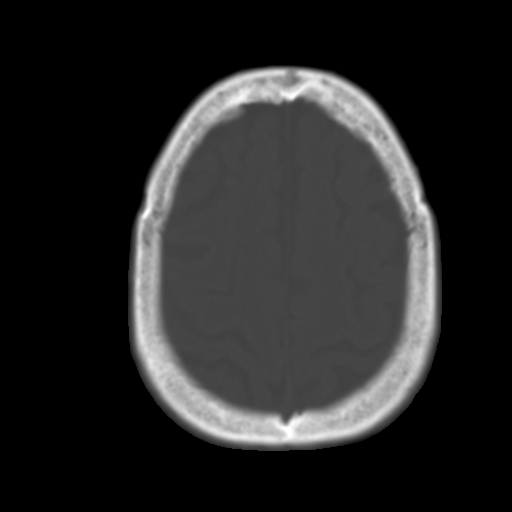
[im 27/33  brain]
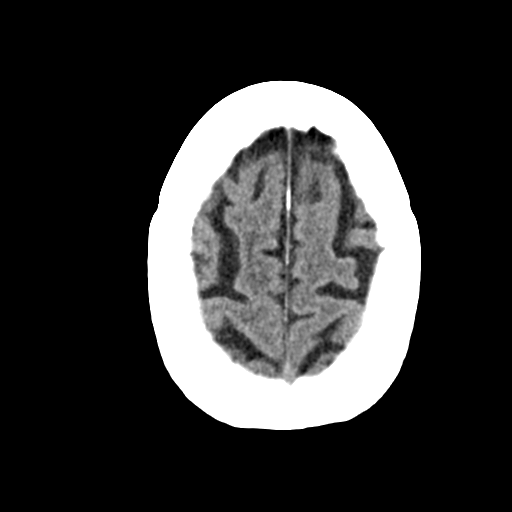
[im 29/33  brain]
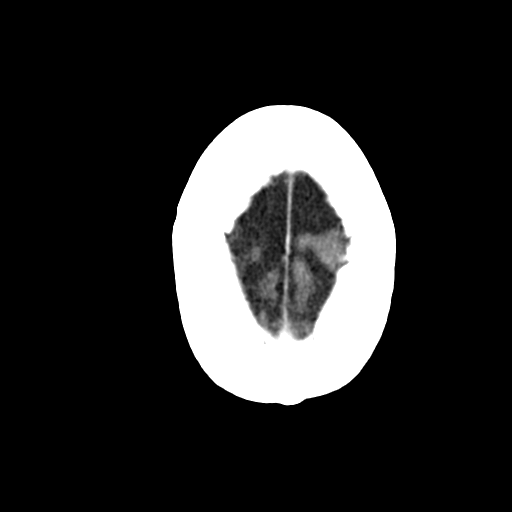
[im 31/33  brain]
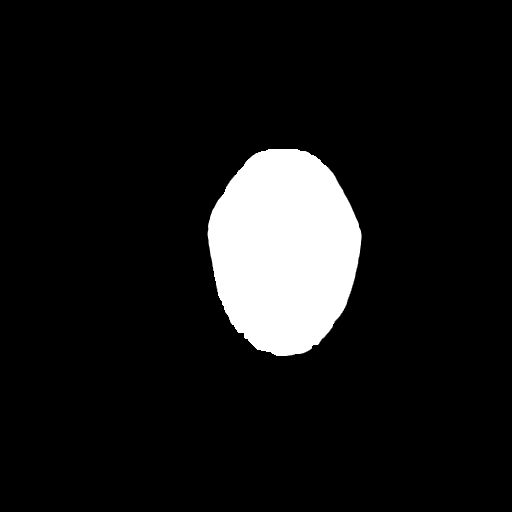

[16 of 30 positions shown; findings below may reference images not displayed]

FINDINGS: Brain: No acute intracranial hemorrhage. No midline shift or mass
effect. Gray-white differentiation maintained. Unremarkable
appearance of the ventricular system. Mild brain volume loss. No
significant temporal lobe volume loss. Confluent hypodensity in the
periventricular white matter. No comparison CT.

Vascular: Calcifications of the anterior circulation.

Skull: No acute fracture.  No aggressive bone lesion identified.

Sinuses/Orbits: Unremarkable appearance of the orbits. Mastoid air
cells clear. No middle ear effusion. No significant sinus disease.

Other: None
IMPRESSION: Head CT negative for acute intracranial abnormality.

Evidence of chronic microvascular ischemic disease, with associated
intracranial atherosclerosis.

## 2019-10-15 IMAGING — DX DG CHEST 1V PORT
1 series · 1 of 1 positions shown · non-contrast
Comparison: None.

CLINICAL DATA: ST-elevation myocardial infarction.

EXAM:
PORTABLE CHEST 1 VIEW

[chest]
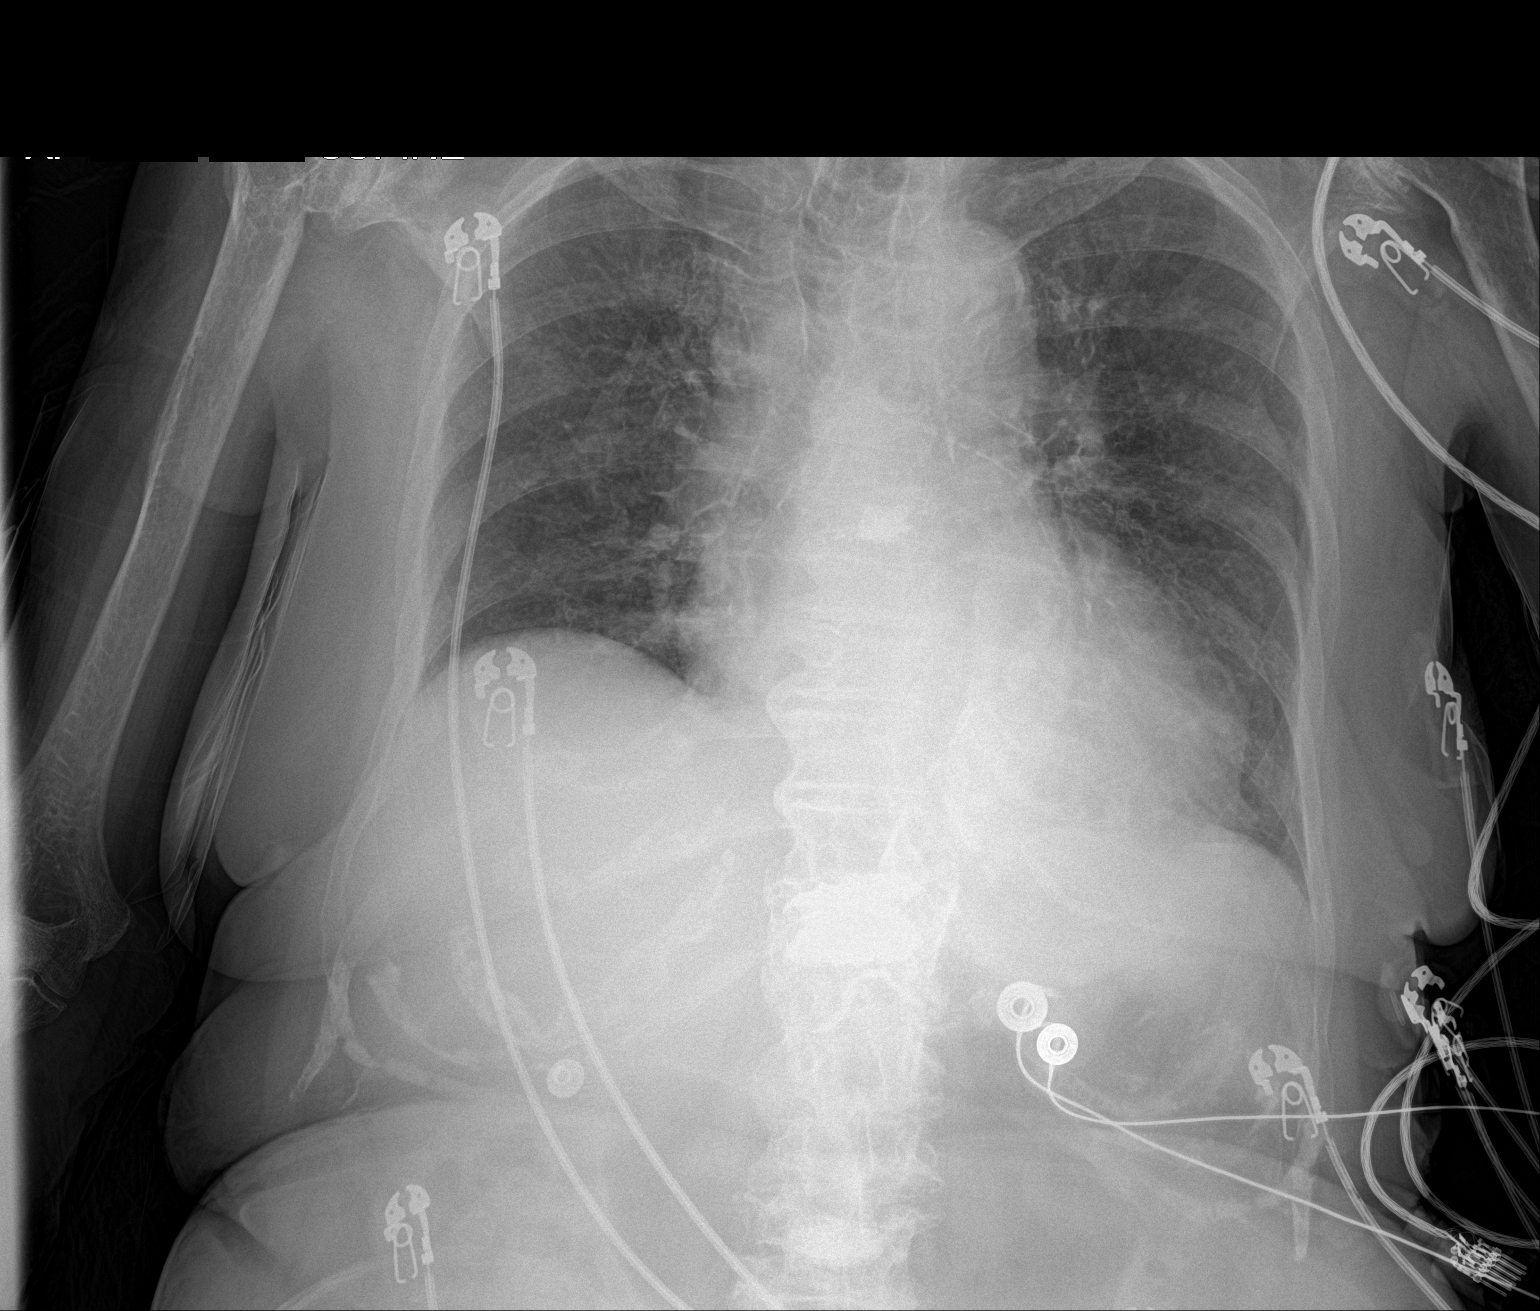

[1 of 1 positions shown; findings below may reference images not displayed]

FINDINGS: The heart size and mediastinal contours are within normal limits.
Both lungs are clear. Aortic atherosclerosis.
IMPRESSION: No active disease.
